# Patient Record
Sex: Female | Born: 2005
Health system: Southern US, Community
[De-identification: ages and names within clinical notes are randomized; demographics above are authoritative.]

## PROBLEM LIST (undated history)

## (undated) HISTORY — PX: NO PAST SURGERIES: SHX2092

---
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
## 2024-01-15 DIAGNOSIS — S93492A Sprain of other ligament of left ankle, initial encounter: Secondary | ICD-10-CM | POA: Insufficient documentation

## 2024-01-15 DIAGNOSIS — M25572 Pain in left ankle and joints of left foot: Secondary | ICD-10-CM | POA: Insufficient documentation

## 2024-02-06 ENCOUNTER — Encounter (HOSPITAL_BASED_OUTPATIENT_CLINIC_OR_DEPARTMENT_OTHER): Payer: Self-pay | Admitting: Internal Medicine

## 2024-02-06 DIAGNOSIS — R0683 Snoring: Secondary | ICD-10-CM

## 2024-04-18 NOTE — Progress Notes (Unsigned)
 04/20/24- 17 yoF for sleep evaluation courtesy of Dr Alverna Aver with concern of snoring, insomnia Medical problem list includes Food Allergy,  -melatonin Epworth score-6 Body weight today-141 lbs Discussed the use of AI scribe software for clinical note transcription with the patient, who gave verbal consent to proceed.  History of Present Illness   Wanda Patterson is an 18 year old female who presents with chronic sleep disturbances and daytime exhaustion. She is accompanied by her mother.  She experiences chronic sleep disturbances, Her main concern is with difficulty initiating sleep and with persistent daytime tiredness.  Her sleep schedule is irregular, often going to bed late and staying awake much later, sometimes all night. She tried sertraline for sleep without significant improvement and didn't like the way it made her feel.. She makes moaning noises during sleep but has no other disruptive sleep behaviors. She denies snoring. Daytime exhaustion is significant, affecting her daily functioning. She can nap if schedule allows. Used to drink a lot of coffee but quit due to heartburn. She slept better on vacation in [place] . She denies symptoms of cataplexy and sleep paralysis.  She has a busy self-imposed schedule with school and competitive rowing. This will continue with summer rowing, and she anticipates an aggressive academic and athletic schedule when she starts college in August.   She has had a persistent cough for two months, which is now improving. General health is good, but she has noted tendency to frequent colds. .     Prior to Admission medications   Medication Sig Start Date End Date Taking? Authorizing Provider  Melatonin 3 MG TABS Take 3 mg by mouth at bedtime as needed (if needed for sleep).   Yes [provider]  omeprazole (PRILOSEC) 40 MG capsule Take 40 mg by mouth daily.   Yes [provider]  zaleplon (SONATA) 5 MG capsule 1 or 2 at  bedtime for sleep 04/20/24  Yes Faustina Hood, MD   No past medical history on file. No past surgical history on file. No family history on file. Social History   Socioeconomic History   Marital status: Single    Spouse name: Not on file   Number of children: Not on file   Years of education: Not on file   Highest education level: Not on file  Occupational History   Not on file  Tobacco Use   Smoking status: Never    Passive exposure: Never   Smokeless tobacco: Never  Substance and Sexual Activity   Alcohol use: Never   Drug use: Not on file   Sexual activity: Not on file  Other Topics Concern   Not on file  Social History Narrative   ** Merged History Encounter **       Social Drivers of Health   Financial Resource Strain: Not on file  Food Insecurity: Not on file  Transportation Needs: Not on file  Physical Activity: Not on file  Stress: Not on file  Social Connections: Not on file  Intimate Partner Violence: Not on file   Assessment and Plan:    Cough Persistent cough improving, no significant chest findings, post-cough dyspnea decreasing.  Reassess in future if needed.  Insomnia Chronic insomnia with difficulty initiating and maintaining sleep. Melatonin discontinued, sertraline reduced anxiety but ineffective for sleep. Possible delayed sleep phase syndrome. Her school and athletic schedules increase her concern.  Discussed CBT and zaleplon use. Discussed sleep schedule. - Prescribe zaleplon 5 mg for sleep onset, option to take  two if needed, maximum two. - Provide information on cognitive behavioral therapy for insomnia, refer to local clinical psychologists. - Advise to avoid caffeine after lunch. - Schedule follow-up in two months before college starts.     ROS-see HPI   Negative unless "+" Constitutional:    weight loss, night sweats, fevers, chills, fatigue, lassitude. HEENT:    headaches, difficulty swallowing, tooth/dental problems, sore throat,        sneezing, itching, ear ache, nasal congestion, post nasal drip, snoring CV:    chest pain, orthopnea, PND, swelling in lower extremities, anasarca,                                   dizziness, palpitations Resp:   shortness of breath with exertion or at rest.                productive cough,   non-productive cough, coughing up of blood.              change in color of mucus.  wheezing.   Skin:    rash or lesions. GI:  No-   heartburn, indigestion, abdominal pain, nausea, vomiting, diarrhea,                 change in bowel habits, loss of appetite GU: dysuria, change in color of urine, no urgency or frequency.   flank pain. MS:   joint pain, stiffness, decreased range of motion, back pain. Neuro-     nothing unusual Psych:  change in mood or affect.  depression or anxiety.   memory loss.  OBJ- Physical Exam General- Alert, Oriented, Affect-appropriate, Distress- none acute, +slender Skin- rash-none, lesions- none, excoriation- none Lymphadenopathy- none Head- atraumatic            Eyes- Gross vision intact, PERRLA, conjunctivae and secretions clear            Ears- Hearing, canals-normal            Nose- Clear, no-Septal dev, mucus, polyps, erosion, perforation             Throat- Mallampati II , mucosa clear , drainage- none, tonsils- atrophic Neck- flexible , trachea midline, no stridor , thyroid nl, carotid no bruit Chest - symmetrical excursion , unlabored           Heart/CV- RRR , no murmur , no gallop  , no rub, nl s1 s2                           - JVD- none , edema- none, stasis changes- none, varices- none           Lung- clear to P&A, wheeze- none, cough- none , dullness-none, rub- none           Chest wall-  Abd-  Br/ Gen/ Rectal- Not done, not indicated Extrem- cyanosis- none, clubbing, none, atrophy- none, strength- nl Neuro- grossly intact to observation

## 2024-04-20 ENCOUNTER — Ambulatory Visit (INDEPENDENT_AMBULATORY_CARE_PROVIDER_SITE_OTHER): Admitting: Internal Medicine

## 2024-04-20 ENCOUNTER — Encounter: Payer: Self-pay | Admitting: Internal Medicine

## 2024-04-20 VITALS — HR 65 | Temp 98.1°F | Ht 68.0 in | Wt 141.2 lb

## 2024-04-20 DIAGNOSIS — G4700 Insomnia, unspecified: Secondary | ICD-10-CM

## 2024-04-20 DIAGNOSIS — F5101 Primary insomnia: Secondary | ICD-10-CM

## 2024-04-20 MED ORDER — ZALEPLON 5 MG PO CAPS
ORAL_CAPSULE | ORAL | 1 refills | Status: DC
Start: 2024-04-20 — End: 2024-07-02

## 2024-04-20 NOTE — Patient Instructions (Signed)
 Script sent for trial of Sonata to help with sleep onset when needed 1 or 2 caps at bedtime  You can explore Cognitive Behavioral Therapy:

## 2024-05-05 DIAGNOSIS — R42 Dizziness and giddiness: Secondary | ICD-10-CM | POA: Insufficient documentation

## 2024-06-16 NOTE — Procedures (Signed)
 Orders only

## 2024-06-29 NOTE — Progress Notes (Deleted)
 04/20/24- 18 yoF for sleep evaluation courtesy of Dr Almarie Dollar with concern of snoring, insomnia Medical problem list includes Food Allergy,  -melatonin Epworth score-6 Body weight today-141 lbs Discussed the use of AI scribe software for clinical note transcription with the patient, who gave verbal consent to proceed.  History of Present Illness   Wanda Patterson is an 18 year old female who presents with chronic sleep disturbances and daytime exhaustion. She is accompanied by her mother.  She experiences chronic sleep disturbances, Her main concern is with difficulty initiating sleep and with persistent daytime tiredness.  Her sleep schedule is irregular, often going to bed late and staying awake much later, sometimes all night. She tried sertraline for sleep without significant improvement and didn't like the way it made her feel.. She makes moaning noises during sleep but has no other disruptive sleep behaviors. She denies snoring. Daytime exhaustion is significant, affecting her daily functioning. She can nap if schedule allows. Used to drink a lot of coffee but quit due to heartburn. She slept better on vacation in [place] . She denies symptoms of cataplexy and sleep paralysis.  She has a busy self-imposed schedule with school and competitive rowing. This will continue with summer rowing, and she anticipates an aggressive academic and athletic schedule when she starts college in August.   She has had a persistent cough for two months, which is now improving. General health is good, but she has noted tendency to frequent colds. .   Assessment and Plan:    Cough Persistent cough improving, no significant chest findings, post-cough dyspnea decreasing.  Reassess in future if needed.  Insomnia Chronic insomnia with difficulty initiating and maintaining sleep. Melatonin discontinued, sertraline reduced anxiety but ineffective for sleep. Possible delayed sleep phase syndrome. Her  school and athletic schedules increase her concern.  Discussed CBT and zaleplon  use. Discussed sleep schedule. - Prescribe zaleplon  5 mg for sleep onset, option to take two if needed, maximum two. - Provide information on cognitive behavioral therapy for insomnia, refer to local clinical psychologists. - Advise to avoid caffeine after lunch. - Schedule follow-up in two months before college starts.   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 Competitive rowing.    ROS-see HPI   + = positive Constitutional:    weight loss, night sweats, fevers, chills, fatigue, lassitude. HEENT:    headaches, difficulty swallowing, tooth/dental problems, sore throat,       sneezing, itching, ear ache, nasal congestion, post nasal drip, snoring CV:    chest pain, orthopnea, PND, swelling in lower extremities, anasarca,                                   dizziness, palpitations Resp:   shortness of breath with exertion or at rest.                productive cough,   non-productive cough, coughing up of blood.              change in color of mucus.  wheezing.   Skin:    rash or lesions. GI:  No-   heartburn, indigestion, abdominal pain, nausea, vomiting,  diarrhea,                 change in bowel habits, loss of appetite GU: dysuria, change in color of urine, no urgency or frequency.   flank pain. MS:   joint pain, stiffness, decreased range of motion, back pain. Neuro-     nothing unusual Psych:  change in mood or affect.  depression or anxiety.   memory loss.  OBJ- Physical Exam General- Alert, Oriented, Affect-appropriate, Distress- none acute, +slender Skin- rash-none, lesions- none, excoriation- none Lymphadenopathy- none Head- atraumatic            Eyes- Gross vision intact, PERRLA, conjunctivae and  secretions clear            Ears- Hearing, canals-normal            Nose- Clear, no-Septal dev, mucus, polyps, erosion, perforation             Throat- Mallampati II , mucosa clear , drainage- none, tonsils- atrophic Neck- flexible , trachea midline, no stridor , thyroid nl, carotid no bruit Chest - symmetrical excursion , unlabored           Heart/CV- RRR , no murmur , no gallop  , no rub, nl s1 s2                           - JVD- none , edema- none, stasis changes- none, varices- none           Lung- clear to P&A, wheeze- none, cough- none , dullness-none, rub- none           Chest wall-  Abd-  Br/ Gen/ Rectal- Not done, not indicated Extrem- cyanosis- none, clubbing, none, atrophy- none, strength- nl Neuro- grossly intact to observation

## 2024-06-30 ENCOUNTER — Ambulatory Visit: Admitting: Internal Medicine

## 2024-06-30 ENCOUNTER — Encounter: Payer: Self-pay | Admitting: Internal Medicine

## 2024-07-02 ENCOUNTER — Ambulatory Visit: Attending: Cardiology

## 2024-07-02 ENCOUNTER — Ambulatory Visit: Attending: Cardiology | Admitting: Cardiology

## 2024-07-02 ENCOUNTER — Ambulatory Visit (INDEPENDENT_AMBULATORY_CARE_PROVIDER_SITE_OTHER)

## 2024-07-02 ENCOUNTER — Encounter: Payer: Self-pay | Admitting: Cardiology

## 2024-07-02 VITALS — BP 98/60 | HR 65 | Ht 68.01 in | Wt 140.0 lb

## 2024-07-02 DIAGNOSIS — L700 Acne vulgaris: Secondary | ICD-10-CM | POA: Insufficient documentation

## 2024-07-02 DIAGNOSIS — R55 Syncope and collapse: Secondary | ICD-10-CM | POA: Diagnosis not present

## 2024-07-02 DIAGNOSIS — R42 Dizziness and giddiness: Secondary | ICD-10-CM | POA: Diagnosis not present

## 2024-07-02 DIAGNOSIS — K219 Gastro-esophageal reflux disease without esophagitis: Secondary | ICD-10-CM | POA: Insufficient documentation

## 2024-07-02 DIAGNOSIS — R001 Bradycardia, unspecified: Secondary | ICD-10-CM | POA: Diagnosis not present

## 2024-07-02 DIAGNOSIS — M79603 Pain in arm, unspecified: Secondary | ICD-10-CM | POA: Insufficient documentation

## 2024-07-02 DIAGNOSIS — Z9229 Personal history of other drug therapy: Secondary | ICD-10-CM | POA: Insufficient documentation

## 2024-07-02 DIAGNOSIS — L739 Follicular disorder, unspecified: Secondary | ICD-10-CM | POA: Insufficient documentation

## 2024-07-02 DIAGNOSIS — M5134 Other intervertebral disc degeneration, thoracic region: Secondary | ICD-10-CM | POA: Insufficient documentation

## 2024-07-02 DIAGNOSIS — L858 Other specified epidermal thickening: Secondary | ICD-10-CM | POA: Insufficient documentation

## 2024-07-02 DIAGNOSIS — M79673 Pain in unspecified foot: Secondary | ICD-10-CM | POA: Insufficient documentation

## 2024-07-02 DIAGNOSIS — I951 Orthostatic hypotension: Secondary | ICD-10-CM

## 2024-07-02 DIAGNOSIS — N946 Dysmenorrhea, unspecified: Secondary | ICD-10-CM | POA: Insufficient documentation

## 2024-07-02 DIAGNOSIS — G479 Sleep disorder, unspecified: Secondary | ICD-10-CM

## 2024-07-02 DIAGNOSIS — R790 Abnormal level of blood mineral: Secondary | ICD-10-CM | POA: Insufficient documentation

## 2024-07-02 DIAGNOSIS — M549 Dorsalgia, unspecified: Secondary | ICD-10-CM | POA: Insufficient documentation

## 2024-07-02 DIAGNOSIS — R5383 Other fatigue: Secondary | ICD-10-CM | POA: Insufficient documentation

## 2024-07-02 DIAGNOSIS — Z832 Family history of diseases of the blood and blood-forming organs and certain disorders involving the immune mechanism: Secondary | ICD-10-CM | POA: Insufficient documentation

## 2024-07-02 LAB — ECHOCARDIOGRAM COMPLETE
AR max vel: 2.01 cm2
AV Area VTI: 2.15 cm2
AV Area mean vel: 1.94 cm2
AV Mean grad: 3 mmHg
AV Peak grad: 5.9 mmHg
Ao pk vel: 1.22 m/s
Area-P 1/2: 3.15 cm2
Height: 68.01 in
MV M vel: 4.37 m/s
MV Peak grad: 76.4 mmHg
MV VTI: 1.09 cm2
MV Vena cont: 0.5 cm
S' Lateral: 2.9 cm
Weight: 2240 [oz_av]

## 2024-07-02 NOTE — Progress Notes (Signed)
 Cardiology Consultation:    Date:  07/02/2024   ID:  Wanda Patterson, DOB 02-Dec-2005, MRN [id number]  PCP:  Viktoria Norris, MD  Cardiologist:  Lamar Fitch, MD   Referring MD: Viktoria Norris, MD   Chief Complaint  Patient presents with   Bradycardia    History of Present Illness:    Wanda Patterson is a 18 y.o. female who is being seen today for the evaluation of Constellation of symptoms including bradycardia at the request of Viktoria Norris, MD. she is a young healthy girl who for last few months started experiencing this constellation of symptoms does include sensation of stomachaches after eating sometimes she has an urge to go to the restroom after she eats, there is many times associated with dizziness sometimes she can feel her heart palpitating.  She used to exercise aggressively she used to do rowing but because symptomatology that she develop she is afraid to do it now.  She still walk her dog with no difficulties.  But again complain of having those episodes of weakness and fatigue some dizziness.  She was seen by pulmonologist because of some sleeping issues she was giving Sonata  that she use.  Does not smoke does not drink does not have family history of heart issue.  She is moving to [place]  stage to study biology and she is leaving actually this coming Saturday.  She is coming to my office with her mother who is participating in decision making.  There were a lot of question they had.  She never had syncope but she does have episode of presyncope the worst episode she described there was couple weeks ago couple days ago she was trying to get up she got up quickly she became dizzy started having tunnel vision.  She is conscious about the fact that she did not drink a lot.  She also had episode of dizziness and not feeling well while exercising while rowing and that also make her somewhat apprehensive about that.  History reviewed. No pertinent past medical  history.  Past Surgical History:  Procedure Laterality Date   NO PAST SURGERIES      Current Medications: Current Meds  Medication Sig   Melatonin 3 MG TABS Take 3 mg by mouth at bedtime as needed (if needed for sleep).   zaleplon  (SONATA ) 5 MG capsule Take 5 mg by mouth at bedtime as needed for sleep.     Allergies:   Peanut-containing drug products   Social History   Socioeconomic History   Marital status: Single    Spouse name: Not on file   Number of children: Not on file   Years of education: Not on file   Highest education level: Not on file  Occupational History   Not on file  Tobacco Use   Smoking status: Never    Passive exposure: Never   Smokeless tobacco: Never  Vaping Use   Vaping status: Never Used  Substance and Sexual Activity   Alcohol use: Never   Drug use: Not on file   Sexual activity: Not on file  Other Topics Concern   Not on file  Social History Narrative   ** Merged History Encounter **       Social Drivers of Health   Financial Resource Strain: Not on file  Food Insecurity: Not on file  Transportation Needs: Not on file  Physical Activity: Not on file  Stress: Not on file  Social Connections: Not on file  Family History: The patient's family history includes Colon cancer in her maternal grandfather; Hypercholesterolemia in her mother; Stroke in her maternal grandmother. ROS:   Please see the history of present illness.    All 14 point review of systems negative except as described per history of present illness.  EKGs/Labs/Other Studies Reviewed:    The following studies were reviewed today:   EKG:  EKG Interpretation Date/Time:  Thursday July 02 2024 09:41:05 EDT Ventricular Rate:  65 PR Interval:  140 QRS Duration:  88 QT Interval:  388 QTC Calculation: 403 R Axis:   84  Text Interpretation: Normal sinus rhythm with sinus arrhythmia Normal ECG No previous ECGs available Confirmed by Bernie Charleston [phone]) on  07/02/2024 9:44:16 AM    Recent Labs: No results found for requested labs within last 365 days.  Recent Lipid Panel No results found for: CHOL, TRIG, HDL, CHOLHDL, VLDL, LDLCALC, LDLDIRECT  Physical Exam:    VS:  BP 98/60   Pulse 65   Ht 5' 8.01 (1.727 m)   Wt 140 lb (63.5 kg)   SpO2 99%   BMI 21.28 kg/m     Wt Readings from Last 3 Encounters:  07/02/24 140 lb (63.5 kg) (73%, Z= 0.61)*  04/20/24 141 lb 3.2 oz (64 kg) (75%, Z= 0.68)*   * Growth percentiles are based on CDC (Girls, 2-20 Years) data.     GEN:  Well nourished, well developed in no acute distress HEENT: Normal NECK: No JVD; No carotid bruits LYMPHATICS: No lymphadenopathy CARDIAC: RRR, no murmurs, no rubs, no gallops RESPIRATORY:  Clear to auscultation without rales, wheezing or rhonchi  ABDOMEN: Soft, non-tender, non-distended MUSCULOSKELETAL:  No edema; No deformity  SKIN: Warm and dry NEUROLOGIC:  Alert and oriented x 3 PSYCHIATRIC:  Normal affect   ASSESSMENT:    1. Bradycardia   2. Syncope and collapse   3. Orthostatic hypotension   4. Postural dizziness with presyncope   5. Sleep disorder    PLAN:    In order of problems listed above:  Constellation of symptoms that I suspect were probably dealing with some degree of autonomic dysfunction.  There is some component of orthostatic hypotension, probably with some vagal response as well as she may reach criteria for POTS syndrome.  We spent great of time talking about management of this quite complex issue will do Zio patch to make sure she does not have any significant arrhythmia, we will schedule her to have echocardiogram to make sure structurally her heart is normal I recommended to drink plenty of fluid liberate salt and gradually ramping up her exercises.  Will give her a letter excusing her from aggressively exercising rowing team but hopefully she will be gradually able to ramp up In start exercising on the regular basis.  Hopefully  also her situation in a new place in [place]  will get stabilized which may help with the symptomatology.  I doubt very much that we dealing with some organic heart issue, I suspect this is autonomic disalignment, recumbent exercises are very beneficial for situation like that. Bradycardia: Monitor will be done Sleep disorder she is taking Sonata .   Medication Adjustments/Labs and Tests Ordered: Current medicines are reviewed at length with the patient today.  Concerns regarding medicines are outlined above.  Orders Placed This Encounter  Procedures   EKG 12-Lead   ECHOCARDIOGRAM COMPLETE   No orders of the defined types were placed in this encounter.   Signed, [place] DOROTHA Bernie, MD, [facility]. 07/02/2024  10:21 AM    [facility]

## 2024-07-02 NOTE — Patient Instructions (Addendum)
 Medication Instructions:  Your physician recommends that you continue on your current medications as directed. Please refer to the Current Medication list given to you today.  *If you need a refill on your cardiac medications before your next appointment, please call your pharmacy*   Lab Work: None Ordered If you have labs (blood work) drawn today and your tests are completely normal, you will receive your results only by: MyChart Message (if you have MyChart) OR A paper copy in the mail If you have any lab test that is abnormal or we need to change your treatment, we will call you to review the results.   Testing/Procedures: Your physician has requested that you have an echocardiogram. Echocardiography is a painless test that uses sound waves to create images of your heart. It provides your doctor with information about the size and shape of your heart and how well your heart's chambers and valves are working. This procedure takes approximately one hour. There are no restrictions for this procedure. Please do NOT wear cologne, perfume, aftershave, or lotions (deodorant is allowed). Please arrive 15 minutes prior to your appointment time.  Please note: We ask at that you not bring children with you during ultrasound (echo/ vascular) testing. Due to room size and safety concerns, children are not allowed in the ultrasound rooms during exams. Our front office staff cannot provide observation of children in our lobby area while testing is being conducted. An adult accompanying a patient to their appointment will only be allowed in the ultrasound room at the discretion of the ultrasound technician under special circumstances. We apologize for any inconvenience.    WHY IS MY DOCTOR PRESCRIBING ZIO? The Zio system is proven and trusted by physicians to detect and diagnose irregular heart rhythms -- and has been prescribed to hundreds of thousands of patients.  The FDA has cleared the Zio system to  monitor for many different kinds of irregular heart rhythms. In a study, physicians were able to reach a diagnosis 90% of the time with the Zio system1.  You can wear the Zio monitor -- a small, discreet, comfortable patch -- during your normal day-to-day activity, including while you sleep, shower, and exercise, while it records every single heartbeat for analysis.  1Barrett, P., et al. Comparison of 24 Hour Holter Monitoring Versus 14 Day Novel Adhesive Patch Electrocardiographic Monitoring. American Journal of Medicine, 2014.  ZIO VS. HOLTER MONITORING The Zio monitor can be comfortably worn for up to 14 days. Holter monitors can be worn for 24 to 48 hours, limiting the time to record any irregular heart rhythms you may have. Zio is able to capture data for the 51% of patients who have their first symptom-triggered arrhythmia after 48 hours.1  LIVE WITHOUT RESTRICTIONS The Zio ambulatory cardiac monitor is a small, unobtrusive, and water-resistant patch--you might even forget you're wearing it. The Zio monitor records and stores every beat of your heart, whether you're sleeping, working out, or showering.     Follow-Up: At [facility], you and your health needs are our priority.  As part of our continuing mission to provide you with exceptional heart care, we have created designated Provider Care Teams.  These Care Teams include your primary Cardiologist (physician) and Advanced Practice Providers (APPs -  Physician Assistants and Nurse Practitioners) who all work together to provide you with the care you need, when you need it.  We recommend signing up for the patient portal called "MyChart".  Sign up information is provided on this  After Visit Summary.  MyChart is used to connect with patients for Virtual Visits (Telemedicine).  Patients are able to view lab/test results, encounter notes, upcoming appointments, etc.  Non-urgent messages can be sent to your provider as well.   To learn more  about what you can do with MyChart, go to [email or web address].    Your next appointment:   2 month(s)  The format for your next appointment:   In Person  Provider:   Gypsy Balsam, MD    Other Instructions NA

## 2024-07-08 ENCOUNTER — Ambulatory Visit: Payer: Self-pay | Admitting: Cardiology

## 2024-07-09 ENCOUNTER — Ambulatory Visit: Payer: Self-pay | Admitting: Internal Medicine

## 2024-07-09 ENCOUNTER — Encounter: Payer: Self-pay | Admitting: Internal Medicine

## 2024-07-09 NOTE — Telephone Encounter (Signed)
 Copied from CRM #[id number]. Topic: Clinical - Prescription Issue >> Jul 09, 2024 12:23 PM Ismael A wrote: Reason for CRM: Calling in regarding zaleplon  (SONATA ) 5 MG capsule - Dr. Reggy Salt - she stated that they received a notice stating that the medication was banned by the [employer] needs form filled out by provider to have her be exempt, I provided office fax number

## 2024-07-09 NOTE — Telephone Encounter (Signed)
 I called and spoke to pt. I informed pt that if she needed our office to fill out forms, she would just need to provide us  a copy. Pt states she is unable to hand these top us  as [place]. I informed pt that she could mail us  a copy or she could try to send us  a copy via Mychart and we could print it off that. Pt states she would try this and call us  if she has any issues. NFN until paper is received.

## 2024-07-10 NOTE — Telephone Encounter (Signed)
 Forms to be filled out by Dr Neysa

## 2024-07-10 NOTE — Telephone Encounter (Signed)
 Patient sent form via link in MyChart.  Printed form and filled out physician section.  Placed on Dr. Saundra desk in [facility] for signature.  Notified patient. Will wait for signature notified patient.

## 2024-07-15 ENCOUNTER — Telehealth: Payer: Self-pay

## 2024-07-15 NOTE — Telephone Encounter (Signed)
 Pt wants sent to her as PDF

## 2024-07-15 NOTE — Telephone Encounter (Signed)
 Left message on My Chart with Echo results per Dr. Karry note. Routed to PCP.

## 2024-07-20 ENCOUNTER — Telehealth: Payer: Self-pay | Admitting: Cardiology

## 2024-07-20 NOTE — Telephone Encounter (Signed)
 Pt's mother calling in regards to a release letter for sports. Please advise.

## 2024-07-21 NOTE — Telephone Encounter (Signed)
 The note she received was to vague and they will not let her row per her mom. Can we give her a note stating she can row to a moderate intensity and that she her echo was ok?

## 2024-07-22 NOTE — Telephone Encounter (Signed)
Updated letter sent to [place]

## 2024-07-30 ENCOUNTER — Telehealth: Payer: Self-pay | Admitting: Cardiology

## 2024-07-30 NOTE — Telephone Encounter (Signed)
 Pt's mother calling to inquire about heart monitor results. Please advise.

## 2024-08-04 DIAGNOSIS — R55 Syncope and collapse: Secondary | ICD-10-CM

## 2024-08-04 NOTE — Telephone Encounter (Signed)
Patient's mother calling for results.

## 2024-08-06 ENCOUNTER — Telehealth: Payer: Self-pay

## 2024-08-06 NOTE — Telephone Encounter (Signed)
 Left message on My Chart with monitor results per Dr. Karry note. Routed to PCP

## 2024-08-07 ENCOUNTER — Telehealth: Payer: Self-pay

## 2024-08-07 NOTE — Telephone Encounter (Signed)
 Pt viewed monitor results on My Chart per Dr. Vanetta Shawl note. Routed to PCP.

## 2024-08-17 ENCOUNTER — Telehealth: Payer: Self-pay | Admitting: Cardiology

## 2024-08-17 NOTE — Telephone Encounter (Signed)
 Attempted to call the patient. Patient did not answer the phone and the voice mail was full so unable to leave a message.

## 2024-08-17 NOTE — Telephone Encounter (Signed)
 Pt's mother Lauraine is requesting a callback regarding her following up on getting a letter written stating pt has been cleared for D1 sport rowing and no longer has restrictions. Please advise.

## 2024-08-18 NOTE — Telephone Encounter (Signed)
 Dr. Monetta spoke with mother directly and will have Richard send letter

## 2024-08-18 NOTE — Telephone Encounter (Signed)
 Spoke with Dr. Monetta since Dr. Krasowski is on vacation and he agreed to write a letter for the patient stating that she was ok to resume her D1 sports with no restrictions. The letter was written and Dr. Monetta signed it and it was left at the front desk for the patient's mother to pick - up. The patient's mother was called and informed of this and she stated that she was going to run the letter by her daughter's coach to make sure it was good and if it was ok per the coach then she would come by the office and pick - up the letter. Patient's daughter had no further questions at this time.

## 2024-09-03 ENCOUNTER — Ambulatory Visit: Admitting: Cardiology

## 2024-10-05 NOTE — Progress Notes (Deleted)
 04/20/24- 17 yoF for sleep evaluation courtesy of Dr Almarie Dollar with concern of snoring, insomnia Medical problem list includes Food Allergy,  -melatonin Epworth score-6 Body weight today-141 lbs Discussed the use of AI scribe software for clinical note transcription with the patient, who gave verbal consent to proceed.  History of Present Illness   Wanda Patterson is an 18 year old female who presents with chronic sleep disturbances and daytime exhaustion. She is accompanied by her mother.  She experiences chronic sleep disturbances, Her main concern is with difficulty initiating sleep and with persistent daytime tiredness.  Her sleep schedule is irregular, often going to bed late and staying awake much later, sometimes all night. She tried sertraline for sleep without significant improvement and didn't like the way it made her feel.. She makes moaning noises during sleep but has no other disruptive sleep behaviors. She denies snoring. Daytime exhaustion is significant, affecting her daily functioning. She can nap if schedule allows. Used to drink a lot of coffee but quit due to heartburn. She slept better on vacation in [place] . She denies symptoms of cataplexy and sleep paralysis.  She has a busy self-imposed schedule with school and competitive rowing. This will continue with summer rowing, and she anticipates an aggressive academic and athletic schedule when she starts college in August.   She has had a persistent cough for two months, which is now improving. General health is good, but she has noted tendency to frequent colds. .     Prior to Admission medications   Medication Sig Start Date End Date Taking? Authorizing Provider  Melatonin 3 MG TABS Take 3 mg by mouth at bedtime as needed (if needed for sleep).   Yes [provider]  omeprazole (PRILOSEC) 40 MG capsule Take 40 mg by mouth daily.   Yes [provider]  zaleplon  (SONATA ) 5 MG capsule 1 or 2 at  bedtime for sleep 04/20/24  Yes Neysa Reggy BIRCH, MD   No past medical history on file. No past surgical history on file. No family history on file. Social History   Socioeconomic History   Marital status: Single    Spouse name: Not on file   Number of children: Not on file   Years of education: Not on file   Highest education level: Not on file  Occupational History   Not on file  Tobacco Use   Smoking status: Never    Passive exposure: Never   Smokeless tobacco: Never  Substance and Sexual Activity   Alcohol use: Never   Drug use: Not on file   Sexual activity: Not on file  Other Topics Concern   Not on file  Social History Narrative   ** Merged History Encounter **       Social Drivers of Health   Financial Resource Strain: Not on file  Food Insecurity: Not on file  Transportation Needs: Not on file  Physical Activity: Not on file  Stress: Not on file  Social Connections: Not on file  Intimate Partner Violence: Not on file   Assessment and Plan:    Cough Persistent cough improving, no significant chest findings, post-cough dyspnea decreasing.  Reassess in future if needed.  Insomnia Chronic insomnia with difficulty initiating and maintaining sleep. Melatonin discontinued, sertraline reduced anxiety but ineffective for sleep. Possible delayed sleep phase syndrome. Her school and athletic schedules increase her concern.  Discussed CBT and zaleplon  use. Discussed sleep schedule. - Prescribe zaleplon  5 mg for sleep onset, option to take  two if needed, maximum two. - Provide information on cognitive behavioral therapy for insomnia, refer to local clinical psychologists. - Advise to avoid caffeine after lunch. - Schedule follow-up in two months before college starts.    10/06/24- 17 yoF for sleep evaluation courtesy of Dr Almarie Dollar with concern of snoring, insomnia Competitive rowing in school. Medical problem list includes Food Allergy,  -melatonin, zaleplon   5,  Body weight today- We had suggested clinical psych for CBT. Had cardiology w/u for syncope/collapse. Cardiology wrote letter clearing her for D1 sports.      ROS-see HPI   + = positive Constitutional:    weight loss, night sweats, fevers, chills, fatigue, lassitude. HEENT:    headaches, difficulty swallowing, tooth/dental problems, sore throat,       sneezing, itching, ear ache, nasal congestion, post nasal drip, snoring CV:    chest pain, orthopnea, PND, swelling in lower extremities, anasarca,                                   dizziness, palpitations Resp:   shortness of breath with exertion or at rest.                productive cough,   non-productive cough, coughing up of blood.              change in color of mucus.  wheezing.   Skin:    rash or lesions. GI:  No-   heartburn, indigestion, abdominal pain, nausea, vomiting, diarrhea,                 change in bowel habits, loss of appetite GU: dysuria, change in color of urine, no urgency or frequency.   flank pain. MS:   joint pain, stiffness, decreased range of motion, back pain. Neuro-     nothing unusual Psych:  change in mood or affect.  depression or anxiety.   memory loss.  OBJ- Physical Exam General- Alert, Oriented, Affect-appropriate, Distress- none acute, +slender Skin- rash-none, lesions- none, excoriation- none Lymphadenopathy- none Head- atraumatic            Eyes- Gross vision intact, PERRLA, conjunctivae and secretions clear            Ears- Hearing, canals-normal            Nose- Clear, no-Septal dev, mucus, polyps, erosion, perforation             Throat- Mallampati II , mucosa clear , drainage- none, tonsils- atrophic Neck- flexible , trachea midline, no stridor , thyroid nl, carotid no bruit Chest - symmetrical excursion , unlabored           Heart/CV- RRR , no murmur , no gallop  , no rub, nl s1 s2                           - JVD- none , edema- none, stasis changes- none, varices- none           Lung-  clear to P&A, wheeze- none, cough- none , dullness-none, rub- none           Chest wall-  Abd-  Br/ Gen/ Rectal- Not done, not indicated Extrem- cyanosis- none, clubbing, none, atrophy- none, strength- nl Neuro- grossly intact to observation

## 2024-10-06 ENCOUNTER — Ambulatory Visit: Admitting: Internal Medicine
